# Patient Record
Sex: Male | Born: 1970 | Race: White | Hispanic: No | Marital: Married
Health system: Southern US, Community
[De-identification: ages and names within clinical notes are randomized; demographics above are authoritative.]

---

## 2001-06-13 ENCOUNTER — Emergency Department (HOSPITAL_COMMUNITY): Admission: EM | Admit: 2001-06-13 | Discharge: 2001-06-13 | Payer: Self-pay | Admitting: Emergency Medicine

## 2002-07-31 ENCOUNTER — Emergency Department (HOSPITAL_COMMUNITY): Admission: EM | Admit: 2002-07-31 | Discharge: 2002-08-01 | Payer: Self-pay | Admitting: Emergency Medicine

## 2003-01-30 ENCOUNTER — Encounter: Payer: Self-pay | Admitting: *Deleted

## 2003-01-30 ENCOUNTER — Inpatient Hospital Stay (HOSPITAL_COMMUNITY): Admission: EM | Admit: 2003-01-30 | Discharge: 2003-02-01 | Payer: Self-pay | Admitting: *Deleted

## 2003-02-01 ENCOUNTER — Inpatient Hospital Stay (HOSPITAL_COMMUNITY): Admission: EM | Admit: 2003-02-01 | Discharge: 2003-02-04 | Payer: Self-pay | Admitting: Psychiatry

## 2003-03-08 ENCOUNTER — Inpatient Hospital Stay (HOSPITAL_COMMUNITY): Admission: EM | Admit: 2003-03-08 | Discharge: 2003-03-11 | Payer: Self-pay | Admitting: Psychiatry

## 2003-10-22 ENCOUNTER — Emergency Department (HOSPITAL_COMMUNITY): Admission: EM | Admit: 2003-10-22 | Discharge: 2003-10-22 | Payer: Self-pay | Admitting: Emergency Medicine

## 2003-11-30 ENCOUNTER — Ambulatory Visit (HOSPITAL_COMMUNITY): Admission: RE | Admit: 2003-11-30 | Discharge: 2003-11-30 | Payer: Self-pay | Admitting: Family Medicine

## 2005-04-29 ENCOUNTER — Emergency Department (HOSPITAL_COMMUNITY): Admission: EM | Admit: 2005-04-29 | Discharge: 2005-04-30 | Payer: Self-pay | Admitting: Emergency Medicine

## 2005-11-25 ENCOUNTER — Emergency Department (HOSPITAL_COMMUNITY): Admission: EM | Admit: 2005-11-25 | Discharge: 2005-11-26 | Payer: Self-pay | Admitting: Emergency Medicine

## 2009-02-16 ENCOUNTER — Emergency Department (HOSPITAL_COMMUNITY): Admission: EM | Admit: 2009-02-16 | Discharge: 2009-02-16 | Payer: Self-pay | Admitting: Emergency Medicine

## 2010-01-28 IMAGING — CR DG LUMBAR SPINE COMPLETE 4+V
5 series · 5 of 5 positions shown · non-contrast
Comparison: CT abdomen pelvis 02/16/2009

CLINICAL DATA: Trauma, fall

LUMBAR SPINE - COMPLETE 4+ VIEW

[t l-spine a.p.]
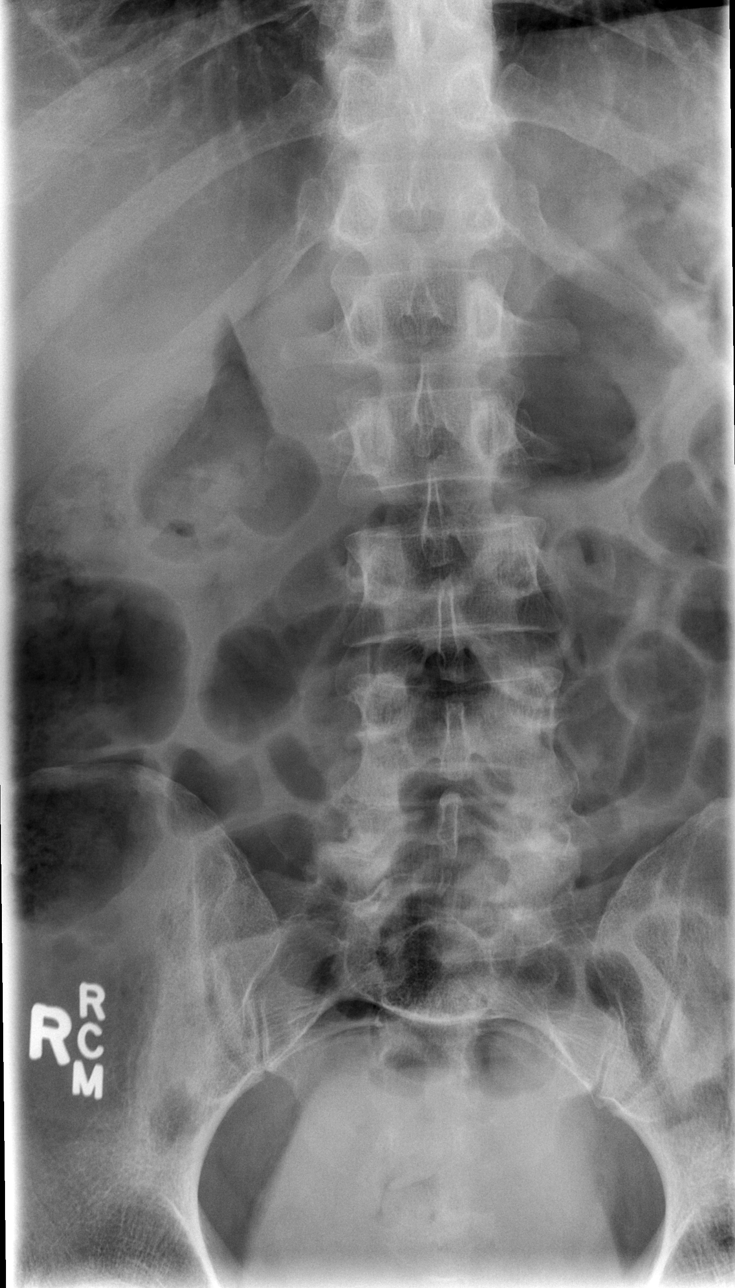

[t l-spine oblique exposure (1 of 2)]
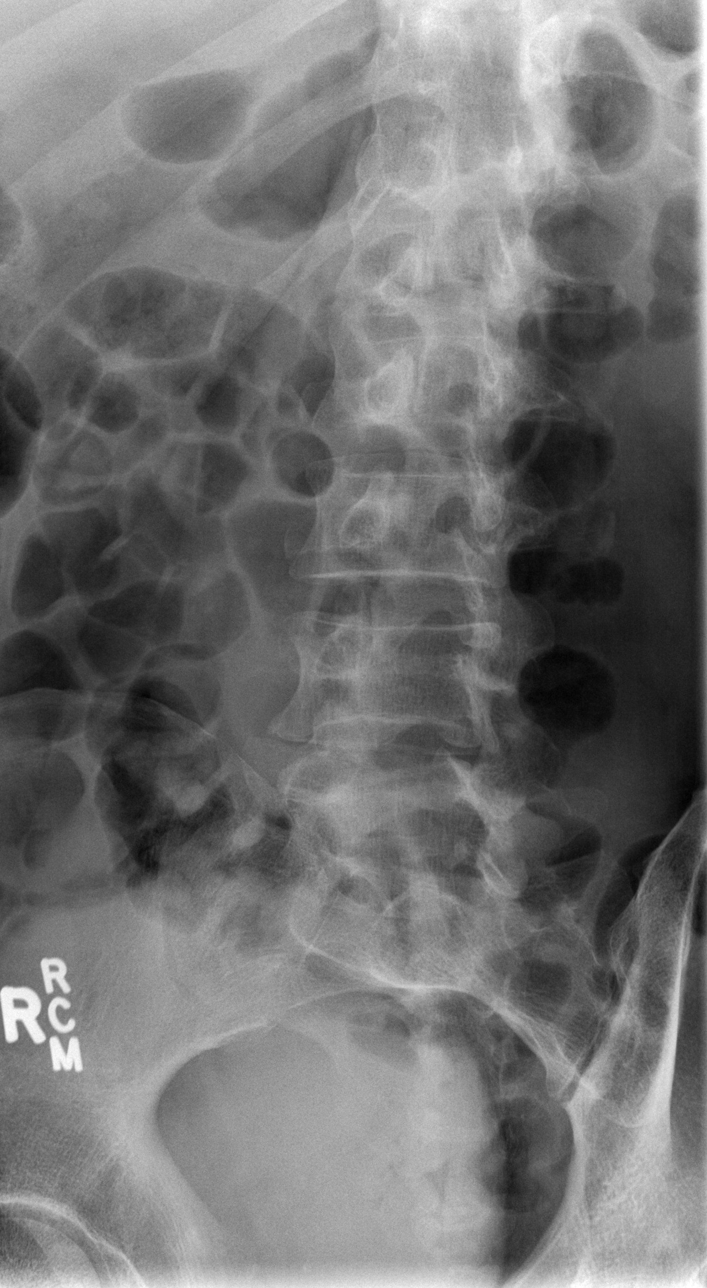

[t l-spine oblique exposure (2 of 2)]
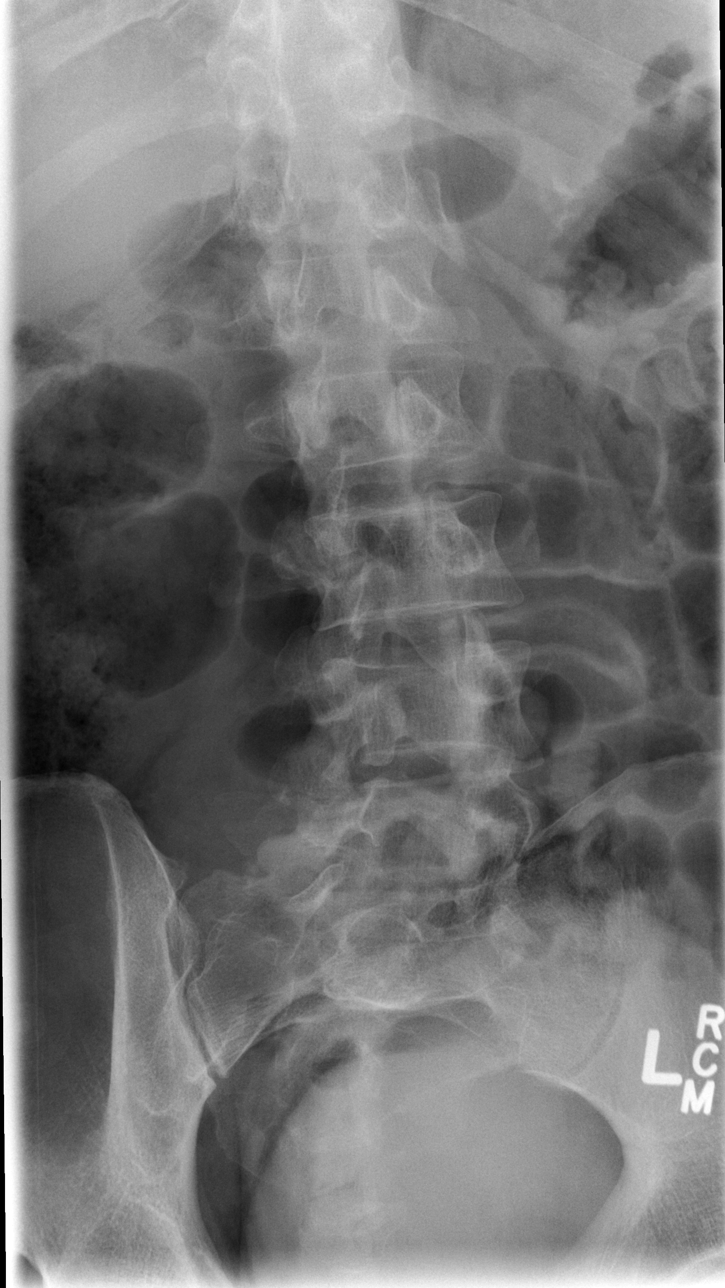

[t l-spine lat]
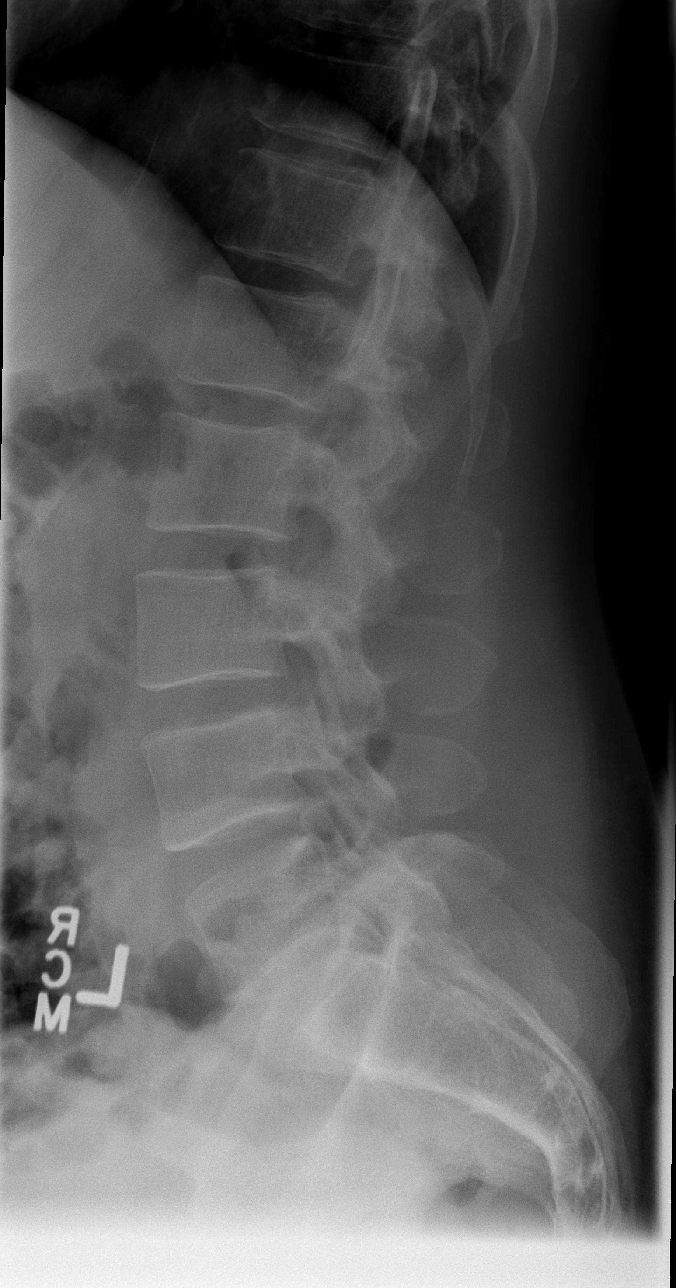

[t l-spine l5-s1 spot]
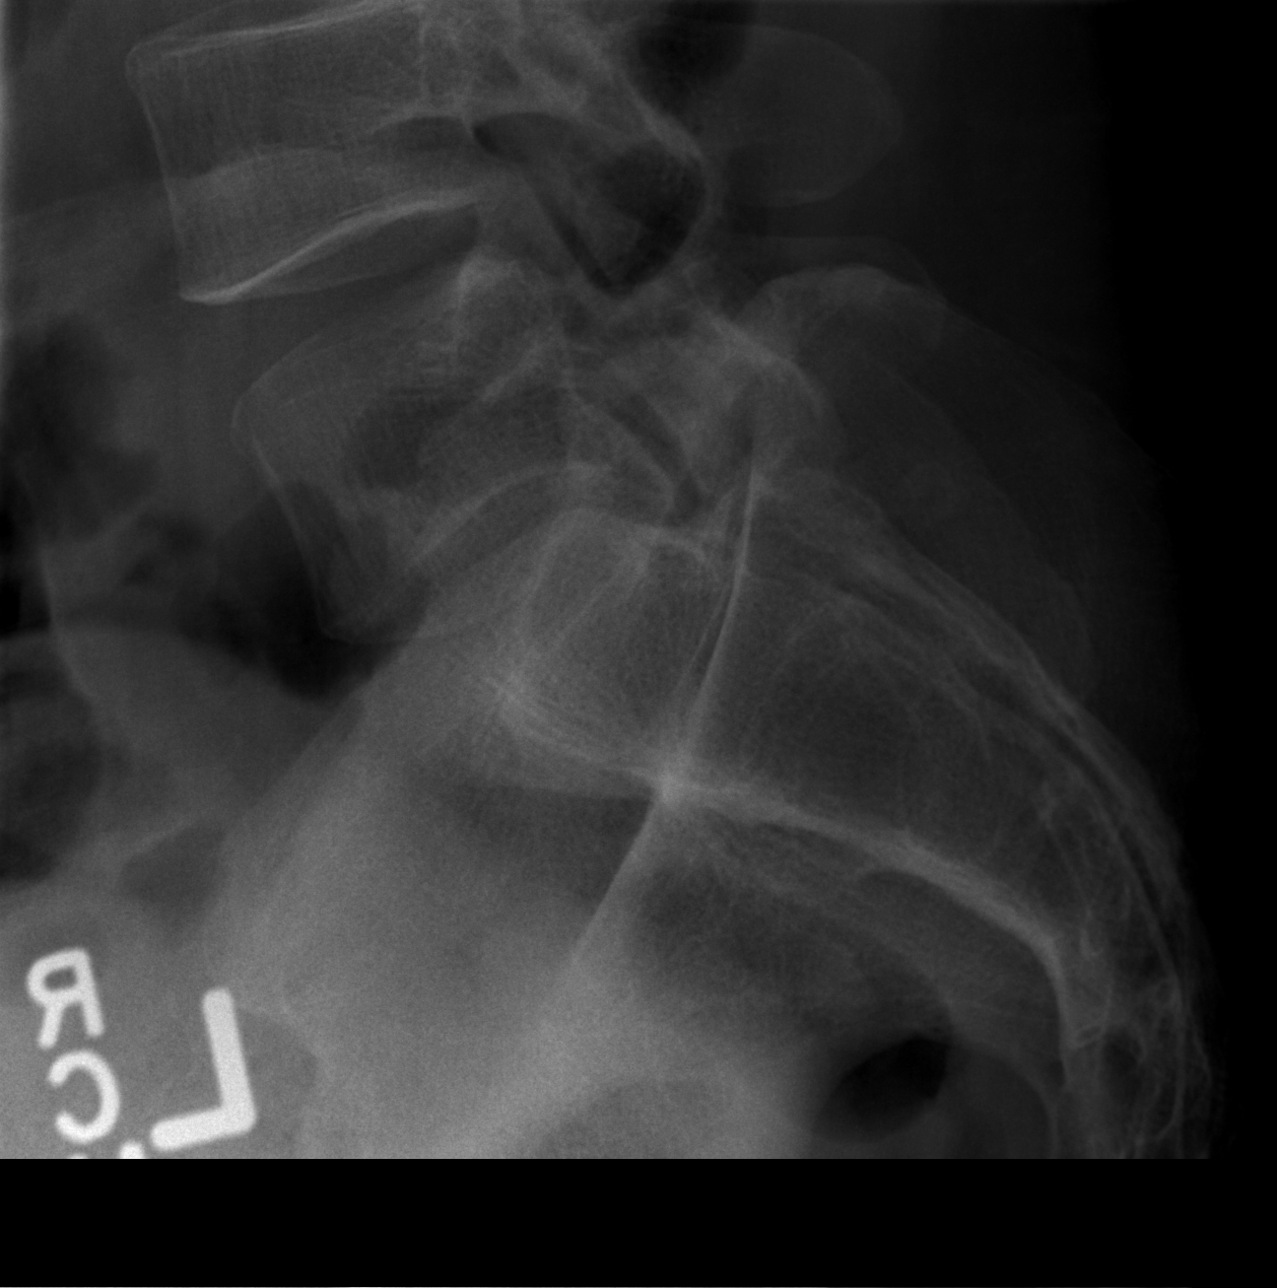

[5 of 5 positions shown; findings below may reference images not displayed]

FINDINGS: Normal alignment of the lumbar spine vertebral bodies.
No loss of vertebral body height.  No evidence of subluxation.
IMPRESSION: No evidence of lumbar spine fracture.

## 2010-12-30 LAB — POCT I-STAT, CHEM 8
Creatinine, Ser: 1.1 mg/dL (ref 0.4–1.5)
HCT: 48 % (ref 39.0–52.0)
Hemoglobin: 16.3 g/dL (ref 13.0–17.0)
Potassium: 3.9 mEq/L (ref 3.5–5.1)
Sodium: 136 mEq/L (ref 135–145)

## 2010-12-30 LAB — SAMPLE TO BLOOD BANK

## 2010-12-30 LAB — CBC
HCT: 42.5 % (ref 39.0–52.0)
Hemoglobin: 14.6 g/dL (ref 13.0–17.0)
MCHC: 34.4 g/dL (ref 30.0–36.0)
RDW: 13.6 % (ref 11.5–15.5)

## 2010-12-30 LAB — RAPID URINE DRUG SCREEN, HOSP PERFORMED
Cocaine: POSITIVE — AB
Opiates: NOT DETECTED

## 2011-02-06 NOTE — H&P (Signed)
NAME:  Noah Colon, Noah Colon                            ACCOUNT NO.:  [id number]   MEDICAL RECORD NO.:  [id number]                   PATIENT TYPE:  INP   LOCATION:  A210                                 FACILITY:  [facility]   PHYSICIAN:  Sarita Bottom, M.D.                  DATE OF BIRTH:  1971-02-11   DATE OF ADMISSION:  01/30/2003  DATE OF DISCHARGE:                                HISTORY & PHYSICAL   PRIMARY MEDICAL DOCTOR:  Dr. Christell Constant of [place].   CHIEF COMPLAINT:  I vomited blood this afternoon.   HISTORY OF PRESENT ILLNESS:  The patient is a 40 year old man with a history  of chronic alcohol abuse.  He was well until about three days ago when he  developed a serious onset of hematemesis which got progressively worse.  He  said he vomited about 6 ounces of bright red blood this afternoon.  He also  noticed some blood in his stool so, he decided to come to emergency room for  evaluation.  He denies having any episodes of  bleeding since coming to the  emergency room seven hours ago.  Patient was treated in the emergency room  with Haldol and Benadryl because of irrational behavior.   REVIEW OF SYSTEMS:  He admits to weakness.  Denies any fever.  RESPIRATORY:  He denies any shortness of breath or cough.  GI:  Admits to nausea and  vomiting.  Denies any diarrhea.  GENITOURINARY:  Denies any dysuria.  PSYCHIATRIC:  Denies any depression but admits to feeling hyped up.  All  other systems reviewed and they were negative.   PAST MEDICAL HISTORY:  1. Significant  for alcohol abuse and alcohol withdrawal.  He has had a     swelling as a result of alcohol withdrawal in the past.  2. He also had an episode of GI bleed in October last year.  He had an     endoscopy done at [facility] but he has not received the results.   MEDICATIONS:  Include Xanax.  He is not sure about doses but he takes it  four to six times a day which he buys off the street.   ALLERGIES:  He had no known drug  allergies.   FAMILY HISTORY:  His dad died from suicide.  His mother died when he was a  child from some hematological disorder which he is not too sure about.   SOCIAL HISTORY:  He is single but has a fiancee.  He has no children.  He  drinks alcohol.  He drinks about half a gallon of vodka sometimes more every  day.  He also smokes marijuana and cigarettes.  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   PHYSICAL EXAMINATION:  VITAL SIGNS:  His blood pressure is 111/59, heart  rate 77, temperature 98.5 degrees Farenheit.  GENERAL:  He is a young  man lying comfortably in bed, not in any acute  distress.  HEENT:  He is not pale.  He is anicteric.  He has dry oral mucosa.  NECK:  Supple.  No lymphadenopathy.  No jugular venous distention.  No  thyromegaly.  CHEST:  Air entry is good bilaterally.  Breath sounds are vesicular.  No  wheezes or crackles were heard.  CVS:  Heart sounds S1 and S2 are normal.  Rhythm is regular.  No murmurs  appreciated.  ABDOMEN:  Soft.  Bowel sounds are present.  He has slight epigastric  tenderness.  No masses or organomegaly were appreciated.  CNS:  He is alert and oriented x 3.  He has no focal deficits.  He has no  flapping tremors at this time.  EXTREMITIES:  He has no edema.  No clubbing, no cyanosis.  RECTAL:  Revealed a stool guaiac which was negative.   DIAGNOSTIC STUDIES:  His chest x-ray was essentially normal .   WBCs 7.9, neutrophil count is 59%, lymphocytes are 275, hemoglobin is 15.6,  hematocrit is 44.0, MCV is 89.6, platelet count is 399.  Sodium 140,  potassium is 3.7, chloride is 107, CO2 is 25, BUN is 11, creatinine is 1.1,  glucose is 96.  Calcium is 8.7.  His urine drug screen is positive for  cocaine and benzodiazepines.  Alcohol level is 299.  His PTT is 29, PT is  12.3, INR is 0.9.   ASSESSMENT/PLAN:  1. Gastrointestinal bleed - probably from upper gastrointestinal bleed from     chronic alcohol abuse.  Patient will be admitted to  telemetry for     monitoring.  I will do serial hemoglobin and hematocrit, type and screen     and hold two units of packed red blood cells.  He will be given IV     Protonix 40 mg daily.  He will be made NPO and gastroenterology will be     called for endoscopy.  2. Alcohol abuse/withdrawal.  The patient will be put on seizure     precautions.  He will be given thiamine 100 mg p.o. daily, folic acid 1     mg daily.  His liver function test will be checked.  And he will also be     hydrated with D-5-W and 1/2 normal saline at 100 cc/hr, 5 cc of     multivitamins will be added to liter of fluid.  3. Substance abuse - counseling has been offered to the patient.  Patient     will need outpatient detoxification.  Patient to be admitted under the     hospitalist service.  Further workup and management will depend on     clinical course.                                               Sarita Bottom, M.D.    DW/MEDQ  D:  01/30/2003  T:  01/30/2003  Job:  [id number]

## 2011-02-06 NOTE — H&P (Signed)
NAME:  Noah Colon, Noah Colon                            ACCOUNT NO.:  [id number]   MEDICAL RECORD NO.:  [id number]                   PATIENT TYPE:  IPS   LOCATION:  0507                                 [facility]ILITY[facility]  [facility]   PHYSICIAN:  Aleatha Borer, MD                   DATE OF BIRTH:  01-15-1971   DATE OF ADMISSION:  03/08/2003  DATE OF DISCHARGE:  03/11/2003                         PSYCHIATRIC ADMISSION ASSESSMENT   IDENTIFYING INFORMATION:  The patient is a 40 year old single white male  voluntarily admitted on March 08, 2003.   HISTORY OF PRESENT ILLNESS:  The patient presents with a history of alcohol  abuse.  The patient was to report earlier for admission but had gotten drunk  prior to admission, he states.  He is requesting detoxification at this  time.  He also has been using Xanax, crack cocaine, marijuana, and heroin,  and relapsed about two weeks ago, drinking a fifth of liquor a day, up to a  gallon per day.  The patient reports that he was detoxified at [facility] prior to this admission.  They had kept him for four days.  He was  experiencing some visual hallucinations while he was being detoxified.  The  patient reports decreased sleep.  Appetite has been fair.   PAST PSYCHIATRIC HISTORY:  This is the second hospitalization at [facility].  He was here one month ago.  His longest history of sobriety  has been three years.  He was attending Alcoholics Anonymous meetings and  found it very beneficial.   SUBSTANCE ABUSE HISTORY:  The patient has been drinking, as stated above.  He has a history of blackouts, no seizures.  He states he was prescribed  Valium and reports no changes in behavior when he drinks.   PAST MEDICAL HISTORY:  Primary care Clarice Bonaventure: Dr. Cain Saupe in [place].  Medical  problems: None.   MEDICATIONS:  He reports a prescription for Valium.   DRUG ALLERGIES:  No known allergies.   PHYSICAL EXAMINATION:  GENERAL:  Physical examination was done  at [facility].   LABORATORY DATA:  His CBC is within normal limits.  TSH is 3.291.  BMET is  within normal limits.  Alcohol level is 222.  CPK is 2132.   SOCIAL HISTORY:  He is a 50 year old single white male.  He lives with his  girlfriend.  He works in Teaching laboratory technician and receiving.  No legal charges.   FAMILY HISTORY:  Cocaine abuse.   MENTAL STATUS EXAM:  He is an alert, well built male.  He is cooperative.  Speech is clear.  The patient feels anxious.  Thought processes are  coherent; no evidence of psychosis.  Cognitive: Intact.  Memory is good  Judgment is fair.  Insight is poor.  Poor impulse control.   ADMISSION DIAGNOSES:   AXIS I:  1. Alcohol dependence.  2. Polysubstance  abuse.   AXIS II:  Deferred.   AXIS III:  None.   AXIS IV:  Other psychosocial problems.   AXIS V:  Current is 30, this past year is 60-65.   INITIAL PLAN OF CARE:  Plan is a voluntary admission to [facility] for alcohol abuse.  Contract for safety, check every 15 minutes.  Will initiate the low dose Librium to detoxify safely.  Will have Seroquel  available for irritability.  The patient is to increase coping skills by  attending groups.  We will educate the patient on the extent of substance  abuse.  Stabilize mood and thinking so the patient can be safe and  functional.  Our long-term goal is for the patient to remain alcohol and  drug free, to attend AA meetings for continued support and to follow up with  mental health, and to be medication compliant.   ESTIMATED LENGTH OF STAY:  Three to five days.     Landry Corporal, N.P.                       Aleatha Borer, MD    JO/MEDQ  D:  04/02/2003  T:  04/02/2003  Job:  [id number]

## 2011-02-06 NOTE — Discharge Summary (Signed)
NAME:  HOPE, HOLST                            ACCOUNT NO.:  [id number]   MEDICAL RECORD NO.:  [id number]                   PATIENT TYPE:  IPS   LOCATION:  0507                                 [facility]ILITY[facility]   PHYSICIAN:  Jeanice Lim, M.D.              DATE OF BIRTH:  Sep 10, 1971   DATE OF ADMISSION:  03/08/2003  DATE OF DISCHARGE:  03/11/2003                                 DISCHARGE SUMMARY   IDENTIFYING DATA:  This is a 40 year old single Caucasian male who presented  with a history of alcohol abuse, wanting to get drunk, requesting detox.  Also using Xanax, crack cocaine and marijuana.  Relapsed two weeks ago,  drinking a fifth per day.   MEDICATIONS:  Valium in the past.   ALLERGIES:  No known drug allergies.   PHYSICAL EXAMINATION:  Essentially within normal limits.  Neurologically  nonfocal.   LABORATORY DATA:  Routine admission labs within normal limits.  Alcohol  level was 222.  CPK 213.  BMET within normal limits.   MENTAL STATUS EXAM:  Alert, well-built male.  Cooperative.  Speech clear.  Mood anxious.  Thought processes goal directed.  Thought content negative  for dangerous ideation or psychotic symptoms.   ADMISSION DIAGNOSES:   AXIS I:  1. Alcohol dependence.  2. Polysubstance abuse.   AXIS II:  Deferred.   AXIS III:  None.   AXIS IV:  Moderate (problems with primary support group and other  psychosocial issues).   AXIS V:  30/60.   HOSPITAL COURSE:  The patient was admitted and ordered routine p.r.n.  medications and underwent further monitoring.  Was placed on a Librium detox  protocol for safe withdrawal.  Was placed on 15-minute checks for safety and  participated in individual, group and milieu therapy including substance  abuse treatment.  The patient reported, initially, positive suicidal  ideation and withdrawal symptoms.  These rapidly resolved and he was  comfortable.   CONDITION ON DISCHARGE:  Significantly improved.  Affect was  brighter.  No  dangerous ideation, psychotic symptoms or acute withdrawal symptoms.   DISCHARGE MEDICATIONS:  1. Symmetrel 100 mg b.i.d.  2. Protonix 40 mg q.a.m.  3. Seroquel 100 mg q.h.s.  4. Lexapro 10 mg q.a.m.   FOLLOW UP:  AA and medication management.   DISCHARGE DIAGNOSES:   AXIS I:  1. Alcohol dependence.  2. Polysubstance abuse.   AXIS II:  Deferred.   AXIS III:  None.   AXIS IV:  Moderate (problems with primary support group and other  psychosocial issues).   AXIS V:  Global Assessment of Functioning on discharge 55.                                               Fayrene Fearing  Jacquelynn Cree, M.D.    JEM/MEDQ  D:  04/04/2003  T:  04/05/2003  Job:  [id number]

## 2011-02-06 NOTE — Discharge Summary (Signed)
NAME:  Noah Colon, Noah Colon                            ACCOUNT NO.:  [id number]   MEDICAL RECORD NO.:  [id number]                   PATIENT TYPE:  IPS   LOCATION:  0500                                 [facility]ILITY[facility]   PHYSICIAN:  Jeanice Lim, M.D.              DATE OF BIRTH:  1971/07/25   DATE OF ADMISSION:  02/01/2003  DATE OF DISCHARGE:  02/04/2003                                 DISCHARGE SUMMARY   IDENTIFYING DATA:  This is a 40 year old single Caucasian male voluntarily  admitted with a history of substance abuse.  He was transferred from [facility] after being admitted there for a GI bleed.  The patient had been  drinking one half to over one half gallon of vodka per day for years and had  a history of blackouts and then seizures.  He had also been taking Xanax up  to 15 per day.  The patient also admitted to using cocaine, opiates, and  Percocet, and smoking and snorting cocaine.  The patient had been  hospitalized at [facility] in the past and also at [place]  [place] for substance abuse rehabilitation.  Longest period of sobriety was  one year three years ago.   MEDICATIONS:  Protonix.   DRUG ALLERGIES:  No known drug allergies.   PHYSICAL EXAMINATION:  GENERAL:  Essentially within normal limits.  NEUROLOGIC:  Nonfocal.   LABORATORY DATA:  Routine admission labs: Urine drug screen was positive for  THC, benzodiazepines, and cocaine.  Amylase 96.  CMET: Within normal limits.  Alcohol level was elevated at 299 on admission.   MENTAL STATUS EXAM:  Alert young male, casually dressed, cooperative with  good eye contact.  Speech: Clear.  Mood: Anxious.  Affect: Anxious.  Thought  process: Goal directed.  Thought content: Negative for dangerous ideation or  psychotic symptoms.  The patient reported no acute suicidal or homicidal  ideation and reported motivation to be abstinent from alcohol and drugs.  Judgment and insight were somewhat poor with a history of poor  impulse  control.   ADMISSION DIAGNOSES:   AXIS I:  1. Polysubstance abuse.  2. Alcohol, cocaine, and benzodiazepine dependence.   AXIS II:  Deferred.   AXIS III:  History of gastrointestinal bleed.   AXIS IV:  Moderate problems related to psychosocial stressors, limited  support system, and chronic substance abuse.   AXIS V:  40/60   HOSPITAL COURSE:  The patient was admitted, ordered routine p.r.n.  medications, underwent further monitoring, and was encouraged to participate  in individual, group, and milieu therapy including substance abuse  treatment. The patient was placed on low dose Librium detoxification  protocol for safe withdrawal, continued on Protonix.  Xanax was  discontinued.  The patient was given Symmetrel for cravings from cocaine and  started on Wellbutrin, targeting depressive symptoms.  The patient reported  a positive response to clinical  intervention, reported a decrease and  eventual resolution of withdrawal symptoms, and no significant cravings at  the time of discharge.   CONDITION ON DISCHARGE:  Condition on discharge was improved.  Mood was more  euthymic.  Affect: Brighter.  Thought processes: Goal directed.  Thought  content: Negative for dangerous ideation or psychotic symptoms.  The patient  reported motivation to be compliant with the aftercare plan.   DISCHARGE MEDICATIONS:  1. Librium 25 mg twice a day for one day, then once a day for one day, then     stop.  2. Protonix 40 mg q.a.m.  3. Reglan 10 mg four times a day.  4. Wellbutrin XL 150 mg q.a.m.  5. Ambien 10 mg q.h.s. p.r.n.   FOLLOW UP:  The patient was to follow up with AA on a daily basis and Mark  P. Ilsa Iha, M.D., and therapist for individual counseling.   DISCHARGE DIAGNOSES:   AXIS I:  1. Polysubstance abuse.  2. Alcohol, cocaine, and benzodiazepine dependence.   AXIS II:  Deferred.   AXIS III:  History of gastrointestinal bleed.   AXIS IV:  Moderate problems related  to psychosocial stressors, limited  support system, and chronic substance abuse.   AXIS V:  Global assessment of functioning on discharge was 55.                                               Jeanice Lim, M.D.    JEM/MEDQ  D:  03/01/2003  T:  03/01/2003  Job:  [id number]

## 2011-02-06 NOTE — Op Note (Signed)
NAME:  Noah Colon, Noah Colon                            ACCOUNT NO.:  [id number]   MEDICAL RECORD NO.:  [id number]                   PATIENT TYPE:  INP   LOCATION:  A210                                 FACILITY:  [facility]   PHYSICIAN:  Lionel December, M.D.                 DATE OF BIRTH:  [birth year]   DATE OF PROCEDURE:  02/01/2003  DATE OF DISCHARGE:                                 OPERATIVE REPORT   PROCEDURE:  Esophagogastroduodenoscopy.   INDICATIONS:  The patient is a 40 year old Caucasian male with a history of  ethanol abuse, who has upper GI bleed.  He is undergoing a diagnostic  examination.  The procedure was reviewed with the patient and informed  consent was obtained.   PREMEDICATION:  Cetacaine spray for pharyngeal topical anesthesia, Demerol  100 mg IV, Versed 20 mg IV in divided dose.   INSTRUMENT USED:  Olympus video system.   FINDINGS:  Procedure performed in endoscopy suite.  The patient's vital  signs and O2 saturation were monitored during the procedure and remained  stable.  The patient was placed in the left lateral recumbent position.  The  endoscope was passed via oropharynx without any difficulty into the  esophagus.   Esophagus:  Mucosa of the esophagus normal.  A small ulcer at the GE  junction with a clean base.  There was a small sliding hiatal hernia as  well.   Stomach:  It was empty and distended very well with insufflation.  Folds of  the proximal stomach were normal.  Examination of the mucosa revealed antral  granularity and erythema, but there were no erosions or ulcers.  Then  pyloric channel was patent.  Angularis was normal.  Scope was retroflexed to  examine fundus and cardia.  He had a swollen linear fold with erosion just  below and to the right of the cardia.  These findings were consistent with  Mallory-Weiss tear, but there was no bleeding or a clot.   Duodenum:  Examination of the bulb revealed normal mucosa.  The scope was  passed to the  second part of the duodenum.  The mucosa and folds were  normal.  Endoscope was withdrawn.  The patient tolerated the procedure well.   FINAL DIAGNOSES:  1. Small single ulcer at the gastroesophageal junction.  2. Sliding hiatal hernia.  3. 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  4. Nonerosive antral gastritis.  5. Suspect he may have bled from Mallory-Weiss tear.   RECOMMENDATIONS:  1. Will continue PPI.  2. H. pylori serology will be checked.  3. Will start him on a full liquid diet today.  4. 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                                               Lionel December, M.D.    NR/MEDQ  D:  01/31/2003  T:  02/01/2003  Job:  [id number]   cc:   Hanley Hays. Dechurch, M.D.  [address]  [place]  [place] [address]  Fax: [phone]   Ernestina Penna, M.D.  [address] [place]  [place] [address]  Fax: [phone]

## 2011-02-06 NOTE — Consult Note (Signed)
NAME:  Noah Colon, Noah Colon                            ACCOUNT NO.:  [id number]   MEDICAL RECORD NO.:  [id number]                   PATIENT TYPE:  INP   LOCATION:  A210                                 FACILITY:  [facility]   PHYSICIAN:  Hanley Hays. Dechurch, M.D.           DATE OF BIRTH:  03-13-1971   DATE OF CONSULTATION:  01/31/2003  DATE OF DISCHARGE:                                   CONSULTATION   REASON FOR CONSULTATION:  Gastrointestinal bleed.   HISTORY OF PRESENT ILLNESS:  This patient is a 40 year old Caucasian male  with a history of ETOH and substance abuse who was admitted with a four day  history of hematemesis.  The patient states he drinks about a 1/2 gallon of  vodka daily.  He also uses cocaine, marijuana, and take unprescribed  narcotic pain medications.  He BC and Goody powders once or twice daily as  well.  About four days ago he began having nausea and vomiting.  He noted a  small volume hematemesis, however, it progressively increased.  He vomited  approximately a 1/2 cup of blood on one occasion.  He therefore presented to  the emergency department for further evaluation.  In the emergency  department he was found to have a hemoglobin of 15.6, hematocrit 44, MCV  89.6.  BUN 11, creatinine 1.1.  Total bilirubin 0.5, alkaline phosphatase  61, SGOT 51, SGPT 30, albumin 3.4.  Urine drug screen positive for  benzodiazepines, cocaine, and tetrahydrocannibinol.  ETOH level was 299.  Acute abdominal series was unremarkable.  Today's hemoglobin was 13.9,  hematocrit 40.1.  The patient says he has heartburn symptoms.  He has seen  melena and bright red blood per rectum.  Denies any dysphagia or  odynophagia.  He has had about a 20 pound weight loss over the last several  months.  He complains of upper abdominal pain as well.  He denies any  constipation, diarrhea, dysphagia.   He reports having an upper GI bleed about a year ago where he was evaluated  at [facility].  He was admitted for approximately a week  with this as well as pancreatitis.  He had upper and lower endoscopies.  He  does not recall the findings.   CURRENT MEDICATIONS:  He does not have any prescribed medications, however,  he is taking Xanax, Percodan, Percocet.  He takes [facility] Powders once to twice  daily.  He takes Rolaids p.r.n.   ALLERGIES:  No known drug allergies.   PAST MEDICAL HISTORY:  1. History of upper gastrointestinal bleed as outlined above.  2. Alcohol and substance abuse.  3. History of seizure disorder, possibly alcohol withdrawal.   FAMILY HISTORY:  Mother died of a hematological disorder.  Father committed  suicide.   SOCIAL HISTORY:  He is single, but has a fiance.  He has three children.  He  drinks a 1/2 gallon  of vodka daily.  He uses cocaine, marijuana, and  occasionally smokes cigarettes.  He denies any injectable drug use.  Problems with intranasal drug use.  He does not have any tattoos and has  never had a blood transfusion.  He is a Scientist, product/process development.   REVIEW OF SYMPTOMS:  Please see HPI for GI and general.  CARDIOPULMONARY:  He denies any chest pain, shortness of breath.   PHYSICAL EXAMINATION:  VITAL SIGNS:  Height 75 inches, weight 226,  temperature 97.8, pulse 88, respirations 20, blood pressure 110/58.  GENERAL:  A pleasant, well-developed, well-nourished Caucasian male in no  acute distress.  HEENT:  Conjunctivae pink.  Sclerae nonicteric.  Pupils equal, round,  reactive to light.  Oral mucosa pink and moist without lesions, erythema, or  exudates.  NECK:  No lymphadenopathy or thyromegaly.  CHEST:  Lungs are clear to auscultation.  CARDIAC:  Regular rate and rhythm, normal S1 and S2, no murmurs, rubs, or  gallops.  ABDOMEN:  Positive bowel sounds, soft, nontender.  He has mild tenderness in  the epigastric, right upper quadrant, left upper quadrant abdominal region.  Liver edge palpable at below the right costal margin in the  midclavicular  line.  EXTREMITIES:  No edema.   LABORATORY DATA:  As mentioned in HPI.  In addition, sodium 140, potassium  3.7, glucose 96, albumin 3.4.  PT 12.3, INR 0.9, PTT 29.  Platelets 399,000.  White blood cell count 7.9, MCV 89.6.   IMPRESSION:  1. Upper gastrointestinal bleed in the setting of ETOH/substance abuse and     use of Goody's Powders.  Suspect erosive gastritis/esophagitis versus     peptic ulcer disease or Mallory-Weiss tear.  He has abdominal pain in the     entire upper abdomen.  Given history of pancreatitis, we will assess for     this as well.  2. He complains of bright red blood per rectum.  We will try to obtain     colonoscopy from last year.  His SGOT is mildly elevated.  This is most     likely due to alcohol abuse, but given the patient's history of drug     abuse, we will check for hepatitis B and C.   SUGGESTIONS:  1. Agree with IV Protonix.  2. Esophagogastroduodenoscopy.  3.     Obtain esophagogastroduodenoscopy and colonoscopy reports from [facility].  4. Amylase and lipase, Hepatitis B surface antigen, hepatitis C antibody.   I would like to thank Dr. Josefine Class for allowing [place] to participate in the  care of this patient.     Tana Coast, P.A.                        Hanley Hays Josefine Class, M.D.    LL/MEDQ  D:  01/31/2003  T:  01/31/2003  Job:  [id number]   cc:   Hanley Hays. Dechurch, M.D.  [address]  [place]  [place] [address]  Fax: [phone]   Lionel December, M.D.  [address]  [place]  [place] [address]  Fax: [phone]

## 2011-02-06 NOTE — Consult Note (Signed)
   NAME:  Noah Colon, Noah Colon                            ACCOUNT NO.:  [id number]   MEDICAL RECORD NO.:  [id number]                   PATIENT TYPE:  INP   LOCATION:  A210                                 FACILITY:  [facility]   PHYSICIAN:  Hanley Hays. Dechurch, M.D.           DATE OF BIRTH:  03-05-1971   DATE OF CONSULTATION:  DATE OF DISCHARGE:  02/01/2003                                   CONSULTATION   DIAGNOSES:  1. Polysubstance abuse including alcohol and cocaine and benzodiazepines.  2. Upper gastrointestinal bleed with small gastroesophageal junction ulcer,     Mallory-Weiss tear.  3. Gastritis, probably alcohol induced.  4. Alcoholic hepatitis.  5. H.pylori negative, less than 0.9.  6. Hepatitis B surface antibody and C antibody negative.   DISPOSITION:  The patient is discharged to [facility].   MEDICATIONS:  1. Nicotine transdermal patch 14 mg daily.  2. Benzazepines detox protocol.  3. Protonix 40 mg daily.   HOSPITAL COURSE:  This is a 40 year old gentleman with history of chronic  substance abuse who presented to the hospital with GI bleeding.  He was  hemodynamically stable.  He had blood in his stool and his vomitus.  He  underwent endoscopy which revealed the above findings.  He received no blood  products.  Hemoglobin was 14.3 at the time of discharge.  The patient agreed  to consultation with the ACT team for substance abuse treatment.  He was  deemed stable and transferred to [facility] on Feb 01, 2003, as noted above.                                               Hanley Hays Josefine Class, M.D.    FED/MEDQ  D:  03/03/2003  T:  03/03/2003  Job:  [id number]
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
## 2023-03-31 ENCOUNTER — Other Ambulatory Visit: Payer: Self-pay

## 2023-03-31 ENCOUNTER — Ambulatory Visit: Payer: Managed Care, Other (non HMO)

## 2023-03-31 ENCOUNTER — Ambulatory Visit (INDEPENDENT_AMBULATORY_CARE_PROVIDER_SITE_OTHER): Payer: Managed Care, Other (non HMO) | Admitting: Family Medicine

## 2023-03-31 VITALS — BP 162/96 | HR 62 | Ht 74.0 in | Wt 221.0 lb

## 2023-03-31 DIAGNOSIS — M25511 Pain in right shoulder: Secondary | ICD-10-CM | POA: Diagnosis not present

## 2023-03-31 DIAGNOSIS — G8929 Other chronic pain: Secondary | ICD-10-CM

## 2023-03-31 NOTE — Patient Instructions (Addendum)
Thank you for coming in today.   You received an injection today. Seek immediate medical attention if the joint becomes red, extremely painful, or is oozing fluid.   Please get an Xray today before you leave   Let me know if not better and we will refer you to physical therapy in [facility]
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
## 2023-04-06 ENCOUNTER — Encounter: Payer: Self-pay | Admitting: Family Medicine

## 2023-04-06 NOTE — Progress Notes (Signed)
Right shoulder x-ray shows some arthritis at the small joint the top of the shoulder.  This is the [facility] joint.

## 2023-04-19 ENCOUNTER — Encounter: Payer: Self-pay | Admitting: Family Medicine

## 2023-04-19 DIAGNOSIS — G8929 Other chronic pain: Secondary | ICD-10-CM

## 2023-04-19 NOTE — Telephone Encounter (Signed)
Per visit note 03/31/23:  Assessment and Plan: 52 y.o. male with right shoulder pain thought to be impingement and perhaps bursitis.  Plan for steroid injection today.  Recommend home exercise program.  Physical therapy would be ideal.  If not better with home exercise program and injection consider physical therapy.  He works in [place] so that would be an obvious location for him to have PT.

## 2023-04-21 NOTE — Telephone Encounter (Signed)
Referral placed for PT at [facility].   Forwarding to Dr. Denyse Amass to advise regarding non-narcotic medication for pain.

## 2023-04-22 ENCOUNTER — Other Ambulatory Visit: Payer: Self-pay

## 2023-04-22 DIAGNOSIS — G8929 Other chronic pain: Secondary | ICD-10-CM

## 2023-04-22 MED ORDER — MELOXICAM 15 MG PO TABS: 15.0000 mg | ORAL_TABLET | Freq: Every day | ORAL | 0 refills | Status: DC | PRN

## 2023-04-22 MED ORDER — MELOXICAM 15 MG PO TABS: 15.0000 mg | ORAL_TABLET | Freq: Every day | ORAL | 0 refills | Status: AC | PRN

## 2023-04-22 NOTE — Addendum Note (Signed)
Addended by: Rodolph Bong on: 04/22/2023 07:12 AM   Modules accepted: Orders
